# Patient Record
Sex: Female | Born: 1975 | Race: Black or African American | Hispanic: No | Marital: Single | Smoking: Current every day smoker
Health system: Southern US, Community
[De-identification: ages and names within clinical notes are randomized; demographics above are authoritative.]

## PROBLEM LIST (undated history)

## (undated) DIAGNOSIS — G459 Transient cerebral ischemic attack, unspecified: Secondary | ICD-10-CM

## (undated) HISTORY — PX: APPENDECTOMY: SHX54

## (undated) HISTORY — PX: HAND SURGERY: SHX662

## (undated) HISTORY — PX: ABDOMINAL HYSTERECTOMY: SHX81

---

## 2013-01-19 ENCOUNTER — Emergency Department (HOSPITAL_BASED_OUTPATIENT_CLINIC_OR_DEPARTMENT_OTHER)
Admission: EM | Admit: 2013-01-19 | Discharge: 2013-01-19 | Disposition: A | Discharge status: Home / Self Care | Payer: Self-pay | Attending: Emergency Medicine | Admitting: Emergency Medicine

## 2013-01-19 ENCOUNTER — Encounter (HOSPITAL_BASED_OUTPATIENT_CLINIC_OR_DEPARTMENT_OTHER): Payer: Self-pay | Admitting: Emergency Medicine

## 2013-01-19 DIAGNOSIS — Y939 Activity, unspecified: Secondary | ICD-10-CM | POA: Insufficient documentation

## 2013-01-19 DIAGNOSIS — W57XXXA Bitten or stung by nonvenomous insect and other nonvenomous arthropods, initial encounter: Secondary | ICD-10-CM | POA: Insufficient documentation

## 2013-01-19 DIAGNOSIS — F17200 Nicotine dependence, unspecified, uncomplicated: Secondary | ICD-10-CM | POA: Insufficient documentation

## 2013-01-19 DIAGNOSIS — S40269A Insect bite (nonvenomous) of unspecified shoulder, initial encounter: Secondary | ICD-10-CM | POA: Insufficient documentation

## 2013-01-19 DIAGNOSIS — Y9229 Other specified public building as the place of occurrence of the external cause: Secondary | ICD-10-CM | POA: Insufficient documentation

## 2013-01-19 NOTE — ED Notes (Signed)
Patient states she stayed at a hotel five days ago.  States she has insect bites on her left arm and hand.

## 2013-01-19 NOTE — ED Provider Notes (Signed)
CSN: [id number]     Arrival date & time 01/19/13  0846 History   First MD Initiated Contact with Patient 01/19/13 [phone]     Chief Complaint  Patient presents with  . Insect Bite   (Consider location/radiation/quality/duration/timing/severity/associated sxs/prior Treatment) Patient is a 37 y.o. female presenting with animal bite. The history is provided by the patient. No language interpreter was used.  Animal Bite Attacking animal: bug. Location:  Shoulder/arm Shoulder/arm injury location:  L arm, R arm, L forearm and R forearm Time since incident:  12 hours Pain details:    Quality:  Itching   Severity:  No pain Incident location:  Another residence Relieved by:  Nothing Associated symptoms: rash     History reviewed. No pertinent past medical history. Past Surgical History  Procedure Laterality Date  . Abdominal hysterectomy    . Appendectomy     No family history on file. History  Substance Use Topics  . Smoking status: Current Every Day Smoker    Types: Cigarettes  . Smokeless tobacco: Not on file  . Alcohol Use: Yes     Comment: rarely   OB History   Grav Para Term Preterm Abortions TAB SAB Ect Mult Living                 Review of Systems  Skin: Positive for rash.  All other systems reviewed and are negative.    Allergies  Review of patient's allergies indicates not on file.  Home Medications  No current outpatient prescriptions on file. BP 145/78  Pulse 79  Temp(Src) 98.6 F (37 C) (Oral)  Resp 20  SpO2 100% Physical Exam  Vitals reviewed. Constitutional: She is oriented to person, place, and time. She appears well-developed and well-nourished.  HENT:  Head: Normocephalic.  Cardiovascular: Normal rate.   Pulmonary/Chest: Effort normal.  Musculoskeletal: She exhibits tenderness.  Scattered bites  Neurological: She is alert and oriented to person, place, and time. She has normal reflexes.  Skin: Skin is warm.  Psychiatric: She has a normal  mood and affect.    ED Course  Procedures (including critical care time) Labs Review Labs Reviewed - No data to display Imaging Review No results found.  EKG Interpretation   None       MDM   1. Bed bug bite    Hydrocortisone and benadryl    Elson Areas, PA-C 01/19/13 [phone]

## 2013-01-21 NOTE — ED Provider Notes (Signed)
History/physical exam/procedure(s) were performed by non-physician practitioner and as supervising physician I was immediately available for consultation/collaboration. I have reviewed all notes and am in agreement with care and plan.   Thelia Tanksley S Zeena Starkel, MD 01/21/13 1506 

## 2013-01-22 ENCOUNTER — Emergency Department (HOSPITAL_BASED_OUTPATIENT_CLINIC_OR_DEPARTMENT_OTHER)
Admission: EM | Admit: 2013-01-22 | Discharge: 2013-01-22 | Disposition: A | Discharge status: Home / Self Care | Payer: Self-pay | Attending: Emergency Medicine | Admitting: Emergency Medicine

## 2013-01-22 ENCOUNTER — Encounter (HOSPITAL_BASED_OUTPATIENT_CLINIC_OR_DEPARTMENT_OTHER): Payer: Self-pay | Admitting: Emergency Medicine

## 2013-01-22 DIAGNOSIS — B86 Scabies: Secondary | ICD-10-CM | POA: Insufficient documentation

## 2013-01-22 DIAGNOSIS — F17200 Nicotine dependence, unspecified, uncomplicated: Secondary | ICD-10-CM | POA: Insufficient documentation

## 2013-01-22 MED ORDER — PERMETHRIN 5 % EX CREA: TOPICAL_CREAM | Freq: Once | CUTANEOUS | Status: DC

## 2013-01-22 NOTE — ED Notes (Signed)
Patient seen 5 days ago and was dx with bed bug bites. She is not improving and states that she has new bites. Patient stayed in a hotel, but says when she got home she washed everything in hot water

## 2013-01-22 NOTE — ED Provider Notes (Signed)
CSN: [id number]     Arrival date & time 01/22/13  1245 History   First MD Initiated Contact with Patient 01/22/13 1302     Chief Complaint  Patient presents with  . Insect Bite   (Consider location/radiation/quality/duration/timing/severity/associated sxs/prior Treatment) HPI Comments: Pt was seen here 5 days ago for bed bugs but not the rash is worsening and it is in the webs of her fingers and up her arms and on her legs:no fever:pt state that the area is itchy  The history is provided by the patient. No language interpreter was used.    History reviewed. No pertinent past medical history. Past Surgical History  Procedure Laterality Date  . Abdominal hysterectomy    . Appendectomy     No family history on file. History  Substance Use Topics  . Smoking status: Current Every Day Smoker    Types: Cigarettes  . Smokeless tobacco: Not on file  . Alcohol Use: Yes     Comment: rarely   OB History   Grav Para Term Preterm Abortions TAB SAB Ect Mult Living                 Review of Systems  Constitutional: Negative.   Respiratory: Negative.   Cardiovascular: Negative.  Negative for chest pain.    Allergies  Review of patient's allergies indicates no known allergies.  Home Medications   Current Outpatient Rx  Name  Route  Sig  Dispense  Refill  . permethrin (ACTICIN) 5 % cream   Topical   Apply topically once.   60 g   0    BP 102/80  Pulse 94  Temp(Src) 98.2 F (36.8 C) (Oral)  Resp 16  SpO2 100% Physical Exam  Nursing note and vitals reviewed. Constitutional: She is oriented to person, place, and time. She appears well-developed and well-nourished.  Cardiovascular: Normal rate and regular rhythm.   Pulmonary/Chest: Effort normal and breath sounds normal.  Neurological: She is alert and oriented to person, place, and time.  Skin:  Red raised area to fingers, arms and legs    ED Course  Procedures (including critical care time) Labs Review Labs  Reviewed - No data to display Imaging Review No results found.  EKG Interpretation   None       MDM   1. Scabies    Will treat for scabies    Teressa Lower, NP 01/22/13 1338

## 2013-01-22 NOTE — ED Provider Notes (Signed)
Medical screening examination/treatment/procedure(s) were performed by non-physician practitioner and as supervising physician I was immediately available for consultation/collaboration.  EKG Interpretation   None         Glynn Octave, MD 01/22/13 [phone]

## 2013-05-14 ENCOUNTER — Emergency Department (HOSPITAL_BASED_OUTPATIENT_CLINIC_OR_DEPARTMENT_OTHER)
Admission: EM | Admit: 2013-05-14 | Discharge: 2013-05-14 | Disposition: A | Discharge status: Home / Self Care | Payer: Self-pay | Attending: Emergency Medicine | Admitting: Emergency Medicine

## 2013-05-14 ENCOUNTER — Encounter (HOSPITAL_BASED_OUTPATIENT_CLINIC_OR_DEPARTMENT_OTHER): Payer: Self-pay | Admitting: Emergency Medicine

## 2013-05-14 DIAGNOSIS — J069 Acute upper respiratory infection, unspecified: Secondary | ICD-10-CM | POA: Insufficient documentation

## 2013-05-14 DIAGNOSIS — R1013 Epigastric pain: Secondary | ICD-10-CM | POA: Insufficient documentation

## 2013-05-14 DIAGNOSIS — J019 Acute sinusitis, unspecified: Secondary | ICD-10-CM | POA: Insufficient documentation

## 2013-05-14 DIAGNOSIS — F17200 Nicotine dependence, unspecified, uncomplicated: Secondary | ICD-10-CM | POA: Insufficient documentation

## 2013-05-14 DIAGNOSIS — R197 Diarrhea, unspecified: Secondary | ICD-10-CM | POA: Insufficient documentation

## 2013-05-14 MED ORDER — OXYMETAZOLINE HCL 0.05 % NA SOLN
1.0000 | Freq: Two times a day (BID) | NASAL | Status: DC | PRN
  Administered 2013-05-14: 1 via NASAL
  Filled 2013-05-14: qty 15

## 2013-05-14 NOTE — ED Provider Notes (Addendum)
CSN: [id number]     Arrival date & time 05/14/13  1232 History   First MD Initiated Contact with Patient 05/14/13 1247     Chief Complaint  Patient presents with  . Cough     (Consider location/radiation/quality/duration/timing/severity/associated sxs/prior Treatment) Patient is a 38 y.o. female presenting with cough and diarrhea. The history is provided by the patient.  Cough Cough characteristics:  Non-productive Severity:  Moderate Onset quality:  Gradual Duration:  4 days Timing:  Constant Progression:  Unchanged Chronicity:  New Smoker: yes   Context: sick contacts and upper respiratory infection   Relieved by:  Nothing Worsened by:  Lying down Ineffective treatments:  Cough suppressants and decongestant Associated symptoms: ear fullness, ear pain, rhinorrhea and sinus congestion   Associated symptoms: no chest pain, no chills, no fever, no headaches, no shortness of breath and no wheezing   Risk factors: no recent infection and no recent travel   Diarrhea Quality:  Watery Severity:  Moderate Onset quality:  Gradual Number of episodes:  About 5 episodes a day Duration:  4 days Timing:  Intermittent Progression:  Unchanged Relieved by:  None tried Worsened by:  Nothing tried Ineffective treatments:  None tried Associated symptoms: abdominal pain   Associated symptoms: no chills, no fever and no headaches   Associated symptoms comment:  Abd cramping with diarrhea and some epigastric pain Risk factors: no recent antibiotic use     History reviewed. No pertinent past medical history. Past Surgical History  Procedure Laterality Date  . Abdominal hysterectomy    . Appendectomy     No family history on file. History  Substance Use Topics  . Smoking status: Current Every Day Smoker    Types: Cigarettes  . Smokeless tobacco: Not on file  . Alcohol Use: Yes   OB History   Grav Para Term Preterm Abortions TAB SAB Ect Mult Living                 Review of  Systems  Constitutional: Negative for fever and chills.  HENT: Positive for ear pain and rhinorrhea.   Respiratory: Positive for cough. Negative for shortness of breath and wheezing.   Cardiovascular: Negative for chest pain.  Gastrointestinal: Positive for abdominal pain and diarrhea.  Neurological: Negative for headaches.  All other systems reviewed and are negative.      Allergies  Review of patient's allergies indicates no known allergies.  Home Medications   Current Outpatient Rx  Name  Route  Sig  Dispense  Refill  . permethrin (ACTICIN) 5 % cream   Topical   Apply topically once.   60 g   0    BP 127/78  Pulse 74  Temp(Src) 99.1 F (37.3 C) (Oral)  Resp 20  Ht 5\' 11"  (1.803 m)  Wt 249 lb (112.946 kg)  BMI 34.74 kg/m2  SpO2 100% Physical Exam  Nursing note and vitals reviewed. Constitutional: She is oriented to person, place, and time. She appears well-developed and well-nourished. No distress.  HENT:  Head: Normocephalic and atraumatic.  Right Ear: Tympanic membrane is bulging. A middle ear effusion is present.  Left Ear: Tympanic membrane and ear canal normal.  Nose: Mucosal edema and rhinorrhea present. Right sinus exhibits maxillary sinus tenderness.  Mouth/Throat: Posterior oropharyngeal erythema present.  Eyes: Conjunctivae and EOM are normal. Pupils are equal, round, and reactive to light.  Neck: Normal range of motion. Neck supple.  Cardiovascular: Normal rate, regular rhythm and intact distal pulses.   No murmur  heard. Pulmonary/Chest: Effort normal and breath sounds normal. No respiratory distress. She has no wheezes. She has no rales.  Abdominal: Soft. She exhibits no distension. There is tenderness in the epigastric area. There is no rebound and no guarding.  Musculoskeletal: Normal range of motion. She exhibits no edema and no tenderness.  Neurological: She is alert and oriented to person, place, and time.  Skin: Skin is warm and dry. No rash  noted. No erythema.  Psychiatric: She has a normal mood and affect. Her behavior is normal.    ED Course  Procedures (including critical care time) Labs Review Labs Reviewed - No data to display Imaging Review No results found.  EKG Interpretation   None       MDM   Final diagnoses:  Sinusitis, acute  URI, acute   Pt with symptoms consistent with viral URI with sinusitis.  Well appearing here.  No signs of breathing difficulty  No signs of pharyngitis, otitis or abnormal abdominal findings.    pt to return with any further problems.     Gwyneth Sprout, MD 05/14/13 1304  Gwyneth Sprout, MD 05/14/13 1306

## 2013-05-14 NOTE — ED Notes (Signed)
C/o head congestion, prod cough x 4 days-NAD

## 2013-05-14 NOTE — ED Notes (Signed)
Pt now states she is also having diarrhea

## 2015-01-28 ENCOUNTER — Encounter (HOSPITAL_BASED_OUTPATIENT_CLINIC_OR_DEPARTMENT_OTHER): Payer: Self-pay | Admitting: *Deleted

## 2015-01-28 ENCOUNTER — Emergency Department (HOSPITAL_BASED_OUTPATIENT_CLINIC_OR_DEPARTMENT_OTHER): Payer: Self-pay

## 2015-01-28 ENCOUNTER — Emergency Department (HOSPITAL_BASED_OUTPATIENT_CLINIC_OR_DEPARTMENT_OTHER)
Admission: EM | Admit: 2015-01-28 | Discharge: 2015-01-28 | Disposition: A | Discharge status: Home / Self Care | Payer: Self-pay | Attending: Emergency Medicine | Admitting: Emergency Medicine

## 2015-01-28 DIAGNOSIS — R002 Palpitations: Secondary | ICD-10-CM | POA: Insufficient documentation

## 2015-01-28 DIAGNOSIS — Z720 Tobacco use: Secondary | ICD-10-CM | POA: Insufficient documentation

## 2015-01-28 DIAGNOSIS — M25512 Pain in left shoulder: Secondary | ICD-10-CM | POA: Insufficient documentation

## 2015-01-28 LAB — CBC WITH DIFFERENTIAL/PLATELET
BASOS ABS: 0 10*3/uL (ref 0.0–0.1)
Basophils Relative: 0 %
Eosinophils Absolute: 0 10*3/uL (ref 0.0–0.7)
Eosinophils Relative: 1 %
HEMATOCRIT: 42.2 % (ref 36.0–46.0)
Hemoglobin: 13.9 g/dL (ref 12.0–15.0)
Lymphocytes Relative: 42 %
Lymphs Abs: 2.3 10*3/uL (ref 0.7–4.0)
MCH: 33.8 pg (ref 26.0–34.0)
MCHC: 32.9 g/dL (ref 30.0–36.0)
MCV: 102.7 fL — AB (ref 78.0–100.0)
MONO ABS: 0.3 10*3/uL (ref 0.1–1.0)
Monocytes Relative: 6 %
Neutro Abs: 2.8 10*3/uL (ref 1.7–7.7)
Neutrophils Relative %: 51 %
Platelets: 265 10*3/uL (ref 150–400)
RBC: 4.11 MIL/uL (ref 3.87–5.11)
RDW: 13.1 % (ref 11.5–15.5)
WBC: 5.4 10*3/uL (ref 4.0–10.5)

## 2015-01-28 LAB — BASIC METABOLIC PANEL
ANION GAP: 8 (ref 5–15)
BUN: 6 mg/dL (ref 6–20)
CALCIUM: 8.9 mg/dL (ref 8.9–10.3)
CO2: 25 mmol/L (ref 22–32)
Chloride: 105 mmol/L (ref 101–111)
Creatinine, Ser: 0.69 mg/dL (ref 0.44–1.00)
GFR calc Af Amer: >60 mL/min (ref >60)
GFR calc non Af Amer: >60 mL/min (ref >60)
GLUCOSE: 80 mg/dL (ref 65–99)
Potassium: 3.6 mmol/L (ref 3.5–5.1)
Sodium: 138 mmol/L (ref 135–145)

## 2015-01-28 LAB — TROPONIN I: Troponin I: <0.03 ng/mL (ref <0.031)

## 2015-01-28 LAB — TSH: TSH: 0.726 u[IU]/mL (ref 0.350–4.500)

## 2015-01-28 NOTE — ED Notes (Signed)
Palpitations all day while at work. Denies pain.

## 2015-01-28 NOTE — ED Provider Notes (Signed)
CSN: [id number]     Arrival date and time 01/28/15  1332 History   First MD Initiated Contact with Patient 01/28/15 1349     Chief Complaint  Patient presents with  . Palpitations     (Consider location/radiation/quality/duration/timing/severity/associated sxs/prior Treatment) Patient is a 39 y.o. female presenting with palpitations.  The history is provided by the patient and medical records.  Palpitations   39 year old female with no significant past medical history presenting to the ED for palpitations.  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 She states today they seemed more mild than normal. She does note that increased with exertional activity. She states she does have some soreness in her left shoulder, but does not describe this as pain. No chest pain at rest or with exertion.  She denies any shortness of breath, dizziness, diaphoresis, nausea, vomiting, or feelings of syncope. Patient has no known cardiac history. Her grandfather had coronary artery disease and several MIs. Patient is a daily smoker. She denies significant caffeine intake, she reports she stopped drinking soda last week.  No hx of thyroid disease. Patient has never been evaluated for palpitations in the past.  VSS.  History reviewed. No pertinent past medical history. Past Surgical History  Procedure Laterality Date  . Abdominal hysterectomy    . Appendectomy     No family history on file. Social History  Substance Use Topics  . Smoking status: Current Every Day Smoker    Types: Cigarettes  . Smokeless tobacco: None  . Alcohol Use: Yes   OB History    No data available     Review of Systems  Cardiovascular: Positive for palpitations.  Musculoskeletal: Positive for arthralgias.  All other systems reviewed and are negative.     Allergies  Review of patient's allergies indicates no known allergies.  Home Medications   Prior to Admission medications   Medication Sig  Start Date End Date Taking? Authorizing Provider  permethrin (ACTICIN) 5 % cream Apply topically once. 01/22/13   Teressa LowerVrinda Pickering, NP   BP 124/81 mmHg  Pulse 80  Temp(Src) 98.3 F (36.8 C) (Oral)  Resp 20  Ht 5\' 11"  (1.803 m)  Wt 200 lb (90.719 kg)  BMI 27.91 kg/m2  SpO2 100%   Physical Exam  Constitutional: She is oriented to person, place, and time. She appears well-developed and well-nourished. No distress.  HENT:  Head: Normocephalic and atraumatic.  Mouth/Throat: Oropharynx is clear and moist.  Eyes: Conjunctivae and EOM are normal. Pupils are equal, round, and reactive to light.  Neck: Normal range of motion. Neck supple.  Cardiovascular: Normal rate, regular rhythm and normal heart sounds.   Pulmonary/Chest: Effort normal and breath sounds normal. No respiratory distress. She has no wheezes.  Abdominal: Soft. Bowel sounds are normal. There is no tenderness. There is no guarding.  Musculoskeletal: Normal range of motion.  Pain of left trapezius muscle that is reproducible with palpation and movement; no bony deformities, swelling, or overlying skin changes of left arm; arm is NVI  Neurological: She is alert and oriented to person, place, and time.  Skin: Skin is warm and dry. She is not diaphoretic.  Psychiatric: She has a normal mood and affect.  Nursing note and vitals reviewed.   ED Course  Procedures (including critical care time) Labs Review Labs Reviewed  CBC WITH DIFFERENTIAL/PLATELET - Abnormal; Notable for the following:    MCV 102.7 (*)    All other components within normal limits  BASIC METABOLIC  PANEL  TROPONIN I  TSH    Imaging Review Dg Chest 2 View  01/28/2015  CLINICAL DATA:  39 year old current smoker with acute onset of mid chest pain and palpitations earlier today. EXAM: CHEST  2 VIEW COMPARISON:  None. FINDINGS: Cardiomediastinal silhouette unremarkable. Suboptimal inspiration accounts for crowded bronchovascular markings in the lower lobes,  left greater than right. Lungs otherwise clear. No localized airspace consolidation. No pleural effusions. No pneumothorax. Normal pulmonary vascularity. Visualized bony thorax intact. IMPRESSION: Suboptimal inspiration.  No acute cardiopulmonary disease. Electronically Signed   By: Hulan Saas M.D.   On: 01/28/2015 14:31   I have personally reviewed and evaluated these images and lab results as part of my medical decision-making.   EKG Interpretation   Date/Time:  Friday January 28 2015 13:42:42 EDT Ventricular Rate:  93 PR Interval:  138 QRS Duration: 80 QT Interval:  372 QTC Calculation: 462 R Axis:   24 Text Interpretation:  Normal sinus rhythm Normal ECG Mild slurred upstroke  of QRS lead II, V3 with normal PR, doubt delta wave Confirmed by  [facility] MD, ERIN ([id number]) on 01/28/2015 4:15:21 PM      MDM   Final diagnoses:  Palpitations   39 year old female here with palpitations.patient states she gets these frequently, has never sought evaluation.  States symptoms today are more mild than previous.  No chest pain. Mild left shoulder soreness which is reproducible on exam with movement and palpation of the left trapezius-- I suspect this is musculoskeletal, patient works as a Lawyer moving patients daily.  EKG is reassuring, slight upstroke in lead 2 but this does not appear to be a delta wave. Signs and symptoms are not concerning for WPW. Lab work and chest x-ray also reassuring, TSH pending. Patient continues to complain of palpitations, she has remained in normal sinus rhythm here in ED.  Patient has no RF for PE, PERC negative.  Denies excessive caffeine intake.  At this time given negative work-up despite persistent symptoms, low suspicion for ACS, PE, dissection, or other acute cardiac event.  Patient appears stable for discharge. She does not currently have a PCP, will refer to the wellness clinic for follow-up.  Discussed plan with patient, he/she acknowledged understanding  and agreed with plan of care.  Return precautions given for new or worsening symptoms.  Case discussed with attending physician, Dr. Dalene Seltzer, who agrees with assessment and plan of care.  Garlon Hatchet, PA-C 01/28/15 1651  Alvira Monday, MD 01/30/15 1400

## 2015-01-28 NOTE — Discharge Instructions (Signed)
Your cardiac work-up was normal today.   [facility]-- call to make appt for next week. Return to the ED for new or worsening symptoms.   Palpitations A palpitation is the feeling that your heartbeat is irregular or is faster than normal. It may feel like your heart is fluttering or skipping a beat. Palpitations are usually not a serious problem. However, in some cases, you may need further medical evaluation. CAUSES  Palpitations can be caused by:  Smoking.  Caffeine or other stimulants, such as diet pills or energy drinks.  Alcohol.  Stress and anxiety.  Strenuous physical activity.  Fatigue.  Certain medicines.  Heart disease, especially if you have a history of irregular heart rhythms (arrhythmias), such as atrial fibrillation, atrial flutter, or supraventricular tachycardia.  An improperly working pacemaker or defibrillator. DIAGNOSIS  To find the cause of your palpitations, your health care provider will take your medical history and perform a physical exam. Your health care provider may also have you take a test called an ambulatory electrocardiogram (ECG). An ECG records your heartbeat patterns over a 24-hour period. You may also have other tests, such as:  Transthoracic echocardiogram (TTE). During echocardiography, sound waves are used to evaluate how blood flows through your heart.  Transesophageal echocardiogram (TEE).  Cardiac monitoring. This allows your health care provider to monitor your heart rate and rhythm in real time.  Holter monitor. This is a portable device that records your heartbeat and can help diagnose heart arrhythmias. It allows your health care provider to track your heart activity for several days, if needed.  Stress tests by exercise or by giving medicine that makes the heart beat faster. TREATMENT  Treatment of palpitations depends on the cause of your symptoms and can vary greatly. Most cases of palpitations do  not require any treatment other than time, relaxation, and monitoring your symptoms. Other causes, such as atrial fibrillation, atrial flutter, or supraventricular tachycardia, usually require further treatment. HOME CARE INSTRUCTIONS   Avoid:  Caffeinated coffee, tea, soft drinks, diet pills, and energy drinks.  Chocolate.  Alcohol.  Stop smoking if you smoke.  Reduce your stress and anxiety. Things that can help you relax include:  A method of controlling things in your body, such as your heartbeats, with your mind (biofeedback).  Yoga.  Meditation.  Physical activity such as swimming, jogging, or walking.  Get plenty of rest and sleep. SEEK MEDICAL CARE IF:   You continue to have a fast or irregular heartbeat beyond 24 hours.  Your palpitations occur more often. SEEK IMMEDIATE MEDICAL CARE IF:  You have chest pain or shortness of breath.  You have a severe headache.  You feel dizzy or you faint. MAKE SURE YOU:  Understand these instructions.  Will watch your condition.  Will get help right away if you are not doing well or get worse.   This information is not intended to replace advice given to you by your health care provider. Make sure you discuss any questions you have with your health care provider.   Document Released: 03/09/2000 Document Revised: 03/17/2013 Document Reviewed: 05/11/2011 Elsevier Interactive Patient Education [employer]2016 [employer]

## 2015-01-30 ENCOUNTER — Encounter (HOSPITAL_BASED_OUTPATIENT_CLINIC_OR_DEPARTMENT_OTHER): Payer: Self-pay | Admitting: Emergency Medicine

## 2015-01-30 ENCOUNTER — Emergency Department (HOSPITAL_BASED_OUTPATIENT_CLINIC_OR_DEPARTMENT_OTHER)
Admission: EM | Admit: 2015-01-30 | Discharge: 2015-01-30 | Disposition: A | Discharge status: Home / Self Care | Payer: Self-pay | Attending: Emergency Medicine | Admitting: Emergency Medicine

## 2015-01-30 ENCOUNTER — Emergency Department (HOSPITAL_BASED_OUTPATIENT_CLINIC_OR_DEPARTMENT_OTHER): Payer: Self-pay

## 2015-01-30 DIAGNOSIS — R002 Palpitations: Secondary | ICD-10-CM | POA: Insufficient documentation

## 2015-01-30 DIAGNOSIS — Z720 Tobacco use: Secondary | ICD-10-CM | POA: Insufficient documentation

## 2015-01-30 LAB — BASIC METABOLIC PANEL
ANION GAP: 6 (ref 5–15)
BUN: 7 mg/dL (ref 6–20)
CO2: 25 mmol/L (ref 22–32)
Calcium: 8.8 mg/dL — ABNORMAL LOW (ref 8.9–10.3)
Chloride: 107 mmol/L (ref 101–111)
Creatinine, Ser: 0.65 mg/dL (ref 0.44–1.00)
GFR calc Af Amer: >60 mL/min (ref >60)
GFR calc non Af Amer: >60 mL/min (ref >60)
Glucose, Bld: 84 mg/dL (ref 65–99)
POTASSIUM: 3.9 mmol/L (ref 3.5–5.1)
Sodium: 138 mmol/L (ref 135–145)

## 2015-01-30 LAB — CBC
HEMATOCRIT: 42 % (ref 36.0–46.0)
Hemoglobin: 13.9 g/dL (ref 12.0–15.0)
MCH: 34 pg (ref 26.0–34.0)
MCHC: 33.1 g/dL (ref 30.0–36.0)
MCV: 102.7 fL — ABNORMAL HIGH (ref 78.0–100.0)
Platelets: 242 10*3/uL (ref 150–400)
RBC: 4.09 MIL/uL (ref 3.87–5.11)
RDW: 13 % (ref 11.5–15.5)
WBC: 5.6 10*3/uL (ref 4.0–10.5)

## 2015-01-30 LAB — TROPONIN I
Troponin I: <0.03 ng/mL (ref <0.031)
Troponin I: <0.03 ng/mL (ref <0.031)

## 2015-01-30 NOTE — Discharge Instructions (Signed)

## 2015-01-30 NOTE — ED Notes (Signed)
Pt states she has been having intermittent tachycaria that occurs when she stands up to work, today she was fine sitting but when she would stand to work her heart rate would increase. States that her coworker checked her oxgen level with pulse ox and heart rate was 220. Normal in triage, nad noted, + nausea, dizziness and pre-syncopal feeling during episodes

## 2015-02-03 NOTE — ED Provider Notes (Signed)
CSN: [id number]     Arrival date & time 01/30/15  1011 History   First MD Initiated Contact with Patient 01/30/15 1020     Chief Complaint  Patient presents with  . Palpitations     (Consider location/radiation/quality/duration/timing/severity/associated sxs/prior Treatment) Patient is a 39 y.o. female presenting with palpitations.  Palpitations Palpitations quality:  Regular Onset quality:  Gradual Timing:  Constant Progression:  Unchanged Chronicity:  Recurrent Context comment:  Ho similar with unremarkable wu Relieved by:  Nothing Worsened by:  Nothing Associated symptoms: no back pain, no chest pain, no nausea, no shortness of breath and no vomiting     History reviewed. No pertinent past medical history. Past Surgical History  Procedure Laterality Date  . Abdominal hysterectomy    . Appendectomy     History reviewed. No pertinent family history. Social History  Substance Use Topics  . Smoking status: Current Every Day Smoker    Types: Cigarettes  . Smokeless tobacco: None  . Alcohol Use: Yes     Comment: ocassional   OB History    No data available     Review of Systems  Respiratory: Negative for shortness of breath.   Cardiovascular: Positive for palpitations. Negative for chest pain.  Gastrointestinal: Negative for nausea and vomiting.  Musculoskeletal: Negative for back pain.  All other systems reviewed and are negative.     Allergies  Review of patient's allergies indicates no known allergies.  Home Medications   Prior to Admission medications   Not on File   BP 106/70 mmHg  Pulse 80  Temp(Src) 98.2 F (36.8 C) (Oral)  Resp 11  Ht  (1.803 m)  Wt 205 lb (92.987 kg)  BMI 28.60 kg/m2  SpO2 99%  LMP  Physical Exam  Constitutional: She is oriented to person, place, and time. She appears well-developed and well-nourished.  HENT:  Head: Normocephalic and atraumatic.  Right Ear: External ear normal.  Left Ear: External ear normal.   Eyes: Conjunctivae and EOM are normal. Pupils are equal, round, and reactive to light.  Neck: Normal range of motion. Neck supple.  Cardiovascular: Normal rate, regular rhythm, normal heart sounds and intact distal pulses.   Pulmonary/Chest: Effort normal and breath sounds normal.  Abdominal: Soft. Bowel sounds are normal. There is no tenderness.  Musculoskeletal: Normal range of motion.  Neurological: She is alert and oriented to person, place, and time.  Skin: Skin is warm and dry.  Vitals reviewed.   ED Course  Procedures (including critical care time) Labs Review Labs Reviewed  BASIC METABOLIC PANEL - Abnormal; Notable for the following:    Calcium 8.8 (*)    All other components within normal limits  CBC - Abnormal; Notable for the following:    MCV 102.7 (*)    All other components within normal limits  TROPONIN I  TROPONIN I    Imaging Review No results found. I have personally reviewed and evaluated these images and lab results as part of my medical decision-making.   EKG Interpretation None      MDM   Final diagnoses:  Palpitations    39 y.o. female with pertinent PMH of prior palpitations presents with recurrent palpitations identical to prior visits.  Happened while at work.  No anxiety or frank pain.  Wu as above unremarkable.  PERC negative.  DC home to fu with cardiology.    I have reviewed all laboratory and imaging studies if ordered as above  1. Palpitations  Mirian MoMatthew Dail Meece, MD 02/03/15 1256

## 2016-07-09 ENCOUNTER — Emergency Department (HOSPITAL_BASED_OUTPATIENT_CLINIC_OR_DEPARTMENT_OTHER)
Admission: EM | Admit: 2016-07-09 | Discharge: 2016-07-10 | Disposition: A | Discharge status: Home / Self Care | Payer: BLUE CROSS/BLUE SHIELD | Attending: Emergency Medicine | Admitting: Emergency Medicine

## 2016-07-09 ENCOUNTER — Encounter (HOSPITAL_BASED_OUTPATIENT_CLINIC_OR_DEPARTMENT_OTHER): Payer: Self-pay | Admitting: *Deleted

## 2016-07-09 DIAGNOSIS — M25531 Pain in right wrist: Secondary | ICD-10-CM | POA: Diagnosis present

## 2016-07-09 DIAGNOSIS — F17210 Nicotine dependence, cigarettes, uncomplicated: Secondary | ICD-10-CM | POA: Insufficient documentation

## 2016-07-09 DIAGNOSIS — M67431 Ganglion, right wrist: Secondary | ICD-10-CM | POA: Diagnosis not present

## 2016-07-09 DIAGNOSIS — M79672 Pain in left foot: Secondary | ICD-10-CM | POA: Insufficient documentation

## 2016-07-09 DIAGNOSIS — M79671 Pain in right foot: Secondary | ICD-10-CM

## 2016-07-09 NOTE — ED Triage Notes (Signed)
c/o right foot and hand pain x 1 week , denies injury

## 2016-07-10 MED ORDER — LIDOCAINE-EPINEPHRINE 1 %-1:100000 IJ SOLN
INTRAMUSCULAR | Status: AC
  Administered 2016-07-10: 1 mL
  Filled 2016-07-10: qty 1

## 2016-07-10 MED ORDER — LIDOCAINE-EPINEPHRINE 2 %-1:100000 IJ SOLN: 20.0000 mL | Freq: Once | INTRAMUSCULAR | Status: DC

## 2016-07-10 MED ORDER — HYDROCODONE-ACETAMINOPHEN 5-325 MG PO TABS: 1.0000 | ORAL_TABLET | ORAL | 0 refills | Status: DC | PRN

## 2016-07-10 NOTE — ED Provider Notes (Addendum)
[facility] Provider Note: Michele Dell, MD, FACEP  CSN: [id number] MRN: [id number] ARRIVAL: 07/09/16 at 2339 ROOM: MH06/MH06   CHIEF COMPLAINT  Extremity Pain   HISTORY OF PRESENT ILLNESS  Michele Olson is a 41 y.o. female who is being treated for bilateral foot pain by the specialist. The pain is located on the medial and anterior aspects of her heels. Pain is worse with ambulation and weightbearing. She has had steroid injections but the pain has returned. She has an appointment with her foot specialist tomorrow but is requesting pain control in the meantime. She describes the pain as moderate to severe. She also complains of a moderately tender knot on her dorsal right wrist more prominent when she flexes her wrist. She has had this before but it resolved on its own previously.  Consultation with the [facility] state controlled substances database reveals the patient has received one prescription for tramadol and one prescription for hydrocodone cough syrup in the past year.Michele Olson   History reviewed. No pertinent past medical history.  Past Surgical History:  Procedure Laterality Date  . ABDOMINAL HYSTERECTOMY    . APPENDECTOMY      No family history on file.  Social History  Substance Use Topics  . Smoking status: Current Every Day Smoker    Types: Cigarettes  . Smokeless tobacco: Not on file  . Alcohol use Yes     Comment: ocassional    Prior to Admission medications   Not on File    Allergies Patient has no known allergies.   REVIEW OF SYSTEMS  Negative except as noted here or in the History of Present Illness.   PHYSICAL EXAMINATION  Initial Vital Signs Blood pressure 139/74, pulse 84, temperature 98.9 F (37.2 C), temperature source Oral, resp. rate 20, height  (1.702 m), weight 235 lb (106.6 kg), SpO2 100 %.  Examination General: Well-developed, well-nourished female in no acute distress; appearance consistent with age of  record HENT: normocephalic; atraumatic Eyes: pupils equal, round and reactive to light; extraocular muscles intact Neck: supple Heart: regular rate and rhythm Lungs: clear to auscultation bilaterally Abdomen: soft; nondistended; nontender; bowel sounds present Extremities: No deformity; full range of motion; tenderness of anterior medial heels bilaterally; tender fluctuant nodule of mid dorsal right wrist just distal to the flexure consistent with a ganglion cyst Neurologic: Awake, alert and oriented; motor function intact in all extremities and symmetric; no facial droop Skin: Warm and dry Psychiatric: Normal mood and affect   RESULTS  Summary of this visit's results, reviewed by myself:   EKG Interpretation  Date/Time:    Ventricular Rate:    PR Interval:    QRS Duration:   QT Interval:    QTC Calculation:   R Axis:     Text Interpretation:        Laboratory Studies: No results found for this or any previous visit (from the past 24 hour(s)). Imaging Studies: No results found.  ED COURSE  Nursing notes and initial vitals signs, including pulse oximetry, reviewed.  Vitals:   07/09/16 2346 07/09/16 2347  BP:  139/74  Pulse:  84  Resp:  20  Temp:  98.9 F (37.2 C)  TempSrc:  Oral  SpO2:  100%  Weight: 235 lb (106.6 kg)   Height:  (1.702 m)     PROCEDURES   ASPIRATION The skin overlying the ganglion cyst was anesthetized with 0.5 milliliters of 1% lidocaine with epinephrine. The cyst was then entered with an  18-gauge needle and aspirated. The patient tolerated this well and there were no immediate complications. The cyst is no longer palpable.  ED DIAGNOSES     ICD-9-CM ICD-10-CM   1. Bilateral foot pain 729.5 M79.671     M79.672   2. Ganglion cyst of dorsum of right wrist 727.41 M67.431        Michele Libra, MD 07/10/16 4098    Michele Libra, MD 07/10/16 [phone]

## 2016-12-11 ENCOUNTER — Encounter (HOSPITAL_BASED_OUTPATIENT_CLINIC_OR_DEPARTMENT_OTHER): Payer: Self-pay | Admitting: *Deleted

## 2016-12-11 ENCOUNTER — Emergency Department (HOSPITAL_BASED_OUTPATIENT_CLINIC_OR_DEPARTMENT_OTHER)
Admission: EM | Admit: 2016-12-11 | Discharge: 2016-12-11 | Disposition: A | Discharge status: Home / Self Care | Payer: BLUE CROSS/BLUE SHIELD | Attending: Emergency Medicine | Admitting: Emergency Medicine

## 2016-12-11 ENCOUNTER — Emergency Department (HOSPITAL_BASED_OUTPATIENT_CLINIC_OR_DEPARTMENT_OTHER): Payer: BLUE CROSS/BLUE SHIELD

## 2016-12-11 DIAGNOSIS — R05 Cough: Secondary | ICD-10-CM | POA: Diagnosis present

## 2016-12-11 DIAGNOSIS — J069 Acute upper respiratory infection, unspecified: Secondary | ICD-10-CM | POA: Insufficient documentation

## 2016-12-11 DIAGNOSIS — F17210 Nicotine dependence, cigarettes, uncomplicated: Secondary | ICD-10-CM | POA: Insufficient documentation

## 2016-12-11 LAB — RAPID STREP SCREEN (MED CTR MEBANE ONLY): Streptococcus, Group A Screen (Direct): NEGATIVE

## 2016-12-11 NOTE — ED Provider Notes (Signed)
[facility] Provider Note   CSN: [id number] Arrival date & time: 12/11/16  1334     History   Chief Complaint Chief Complaint  Patient presents with  . URI    HPI Michele Olson is a 41 y.o. female.  HPI Patient presents with cough for the last several days. Her granddaughter is similar symptoms. Slight sore throat. No real production of cough. No fevers but has had some myalgias. States that last night she developed pain in her left ear that when throbbing into her left side of the head. No numbness or weakness. No confusion. No vomiting. She is not taking anything for the pain.   History reviewed. No pertinent past medical history.  There are no active problems to display for this patient.   Past Surgical History:  Procedure Laterality Date  . ABDOMINAL HYSTERECTOMY    . APPENDECTOMY      OB History    No data available       Home Medications    Prior to Admission medications   Not on File    Family History History reviewed. No pertinent family history.  Social History Social History  Substance Use Topics  . Smoking status: Current Every Day Smoker    Types: Cigarettes  . Smokeless tobacco: Not on file  . Alcohol use Yes     Comment: ocassional     Allergies   Patient has no known allergies.   Review of Systems Review of Systems  Constitutional: Positive for appetite change and fatigue.  HENT: Positive for ear pain and sore throat.   Eyes: Negative for redness.  Respiratory: Positive for cough.   Cardiovascular: Negative for chest pain.  Gastrointestinal: Negative for abdominal pain.  Genitourinary: Negative for flank pain.  Musculoskeletal: Negative for back pain.  Neurological: Positive for headaches.  Hematological: Negative for adenopathy.  Psychiatric/Behavioral: Negative for confusion.     Physical Exam Updated Vital Signs BP 124/85 (BP Location: Left Arm)   Pulse 82   Temp 99.3 F (37.4 C)   Resp 20   Ht 5'  11" (1.803 m)   Wt 99.8 kg (220 lb)   SpO2 100%   BMI 30.68 kg/m   Physical Exam  Constitutional: She appears well-developed.  HENT:  Head: Atraumatic.  Posterior pharyngeal erythema without exudate. Uvula midline. Right TM normal. Left TM may be slightly bulging but does not have effusion or clouding of the TM.  Neck: Neck supple.  Cardiovascular: Normal rate.   Pulmonary/Chest: Effort normal. She has no wheezes. She has no rales.  Abdominal: Soft.  Musculoskeletal: Normal range of motion.  Neurological: She is alert.  Skin: Skin is warm. Capillary refill takes less than 2 seconds.     ED Treatments / Results  Labs (all labs ordered are listed, but only abnormal results are displayed) Labs Reviewed  RAPID STREP SCREEN (NOT AT [facility])  CULTURE, GROUP A STREP [facility])    EKG  EKG Interpretation None       Radiology Dg Chest 2 View  Result Date: 12/11/2016 CLINICAL DATA:  Cough and congestion for 5 days. EXAM: CHEST  2 VIEW COMPARISON:  08/28/2015 and prior radiographs FINDINGS: The cardiomediastinal silhouette is unremarkable. There is no evidence of focal airspace disease, pulmonary edema, suspicious pulmonary nodule/mass, pleural effusion, or pneumothorax. No acute bony abnormalities are identified. IMPRESSION: No active cardiopulmonary disease. Electronically Signed   By: Harmon Pier M.D.   On: 12/11/2016 14:13    Procedures Procedures (including critical care  time)  Medications Ordered in ED Medications - No data to display   Initial Impression / Assessment and Plan / ED Course  I have reviewed the triage vital signs and the nursing notes.  Pertinent labs & imaging results that were available during my care of the patient were reviewed by me and considered in my medical decision making (see chart for details).     Patient with cough. Minimal production. Also left ear pain and headache. Strep negative and negative chest x-ray. Doubt clinical strep. Instructed on  decongestants and antitussives as needed. Discharge home. Granddaughter has similar symptoms.  Final Clinical Impressions(s) / ED Diagnoses   Final diagnoses:  Upper respiratory tract infection, unspecified type    New Prescriptions There are no discharge medications for this patient.    Benjiman Core, MD 12/11/16 1524

## 2016-12-11 NOTE — ED Notes (Signed)
Patient transported to X-ray 

## 2016-12-11 NOTE — ED Notes (Signed)
ED Provider at bedside. 

## 2016-12-11 NOTE — ED Triage Notes (Signed)
Pt reports URI with left ear pain that started several days ago. No meds PTA

## 2016-12-14 LAB — CULTURE, GROUP A STREP (THRC)

## 2017-06-03 ENCOUNTER — Encounter (HOSPITAL_BASED_OUTPATIENT_CLINIC_OR_DEPARTMENT_OTHER): Payer: Self-pay | Admitting: *Deleted

## 2017-06-03 ENCOUNTER — Other Ambulatory Visit: Payer: Self-pay

## 2017-06-03 ENCOUNTER — Emergency Department (HOSPITAL_BASED_OUTPATIENT_CLINIC_OR_DEPARTMENT_OTHER)
Admission: EM | Admit: 2017-06-03 | Discharge: 2017-06-03 | Disposition: A | Discharge status: Home / Self Care | Payer: BLUE CROSS/BLUE SHIELD | Attending: Emergency Medicine | Admitting: Emergency Medicine

## 2017-06-03 DIAGNOSIS — Z79899 Other long term (current) drug therapy: Secondary | ICD-10-CM | POA: Diagnosis not present

## 2017-06-03 DIAGNOSIS — N12 Tubulo-interstitial nephritis, not specified as acute or chronic: Secondary | ICD-10-CM | POA: Insufficient documentation

## 2017-06-03 DIAGNOSIS — F17210 Nicotine dependence, cigarettes, uncomplicated: Secondary | ICD-10-CM | POA: Diagnosis not present

## 2017-06-03 DIAGNOSIS — R300 Dysuria: Secondary | ICD-10-CM | POA: Diagnosis present

## 2017-06-03 LAB — URINALYSIS, ROUTINE W REFLEX MICROSCOPIC
Bilirubin Urine: NEGATIVE
Glucose, UA: NEGATIVE mg/dL
Ketones, ur: 15 mg/dL — AB
NITRITE: POSITIVE — AB
PROTEIN: NEGATIVE mg/dL
pH: 6 (ref 5.0–8.0)

## 2017-06-03 LAB — URINALYSIS, MICROSCOPIC (REFLEX)

## 2017-06-03 LAB — BASIC METABOLIC PANEL
Anion gap: 9 (ref 5–15)
BUN: 7 mg/dL (ref 6–20)
CALCIUM: 8.8 mg/dL — AB (ref 8.9–10.3)
CO2: 22 mmol/L (ref 22–32)
CREATININE: 0.59 mg/dL (ref 0.44–1.00)
Chloride: 107 mmol/L (ref 101–111)
GFR calc Af Amer: >60 mL/min (ref >60)
GFR calc non Af Amer: >60 mL/min (ref >60)
GLUCOSE: 79 mg/dL (ref 65–99)
Potassium: 3.4 mmol/L — ABNORMAL LOW (ref 3.5–5.1)
Sodium: 138 mmol/L (ref 135–145)

## 2017-06-03 MED ORDER — ONDANSETRON HCL 4 MG/2ML IJ SOLN
4.0000 mg | Freq: Once | INTRAMUSCULAR | Status: AC
  Administered 2017-06-03: 4 mg via INTRAVENOUS
  Filled 2017-06-03: qty 2

## 2017-06-03 MED ORDER — ONDANSETRON 4 MG PO TBDP: 4.0000 mg | ORAL_TABLET | Freq: Three times a day (TID) | ORAL | 0 refills | Status: AC | PRN

## 2017-06-03 MED ORDER — ONDANSETRON 4 MG PO TBDP: 4.0000 mg | ORAL_TABLET | Freq: Once | ORAL | Status: DC

## 2017-06-03 MED ORDER — SODIUM CHLORIDE 0.9 % IV SOLN
1.0000 g | Freq: Once | INTRAVENOUS | Status: AC
  Administered 2017-06-03: 1 g via INTRAVENOUS
  Filled 2017-06-03: qty 10

## 2017-06-03 MED ORDER — SULFAMETHOXAZOLE-TRIMETHOPRIM 800-160 MG PO TABS: 1.0000 | ORAL_TABLET | Freq: Two times a day (BID) | ORAL | 0 refills | Status: AC

## 2017-06-03 NOTE — ED Provider Notes (Signed)
[facility] EMERGENCY DEPARTMENT Provider Note   CSN: [id number] Arrival date & time: 06/03/17  1638     History   Chief Complaint Chief Complaint  Patient presents with  . Dysuria    HPI Michele Olson is a 42 y.o. female presenting to the ED with acute onset of urinary symptoms since last night.  Patient states she has dysuria described as burning, as well as increased frequency.  She also endorses mild left flank pain with persistent nausea.  Denies vomiting, fever or chills, hematuria, frequent UTIs, vaginal bleeding or discharge.    The history is provided by the patient.    History reviewed. No pertinent past medical history.  There are no active problems to display for this patient.   Past Surgical History:  Procedure Laterality Date  . ABDOMINAL HYSTERECTOMY    . APPENDECTOMY      OB History    No data available       Home Medications    Prior to Admission medications   Medication Sig Start Date End Date Taking? Authorizing Provider  ondansetron (ZOFRAN ODT) 4 MG disintegrating tablet Take 1 tablet (4 mg total) by mouth every 8 (eight) hours as needed for nausea or vomiting. 06/03/17   Zahirah Cheslock, [place] N, PA-C  sulfamethoxazole-trimethoprim (BACTRIM DS,SEPTRA DS) 800-160 MG tablet Take 1 tablet by mouth 2 (two) times daily for 14 days. 06/03/17 06/17/17  Bernadene Garside, [place] N, PA-C    Family History No family history on file.  Social History Social History   Tobacco Use  . Smoking status: Current Every Day Smoker    Types: Cigarettes  . Smokeless tobacco: Never Used  Substance Use Topics  . Alcohol use: Yes    Comment: ocassional  . Drug use: No     Allergies   Patient has no known allergies.   Review of Systems Review of Systems  Constitutional: Negative for chills and fever.  Gastrointestinal: Positive for nausea. Negative for abdominal pain and vomiting.  Genitourinary: Positive for dysuria, flank pain (Left), frequency and  urgency. Negative for hematuria, vaginal bleeding and vaginal discharge.  All other systems reviewed and are negative.    Physical Exam Updated Vital Signs BP 108/74 (BP Location: Left Arm)   Pulse 66   Temp 98.7 F (37.1 C) (Oral)   Resp 18   Ht 5\' 10"  (1.778 m)   Wt 101.2 kg (223 lb)   SpO2 100%   BMI 32.00 kg/m   Physical Exam  Constitutional: She appears well-developed and well-nourished.  Non-toxic appearance. She does not appear ill. No distress.  HENT:  Head: Normocephalic and atraumatic.  Mouth/Throat: Oropharynx is clear and moist.  Eyes: Conjunctivae are normal.  Cardiovascular: Normal rate, regular rhythm, normal heart sounds and intact distal pulses.  Pulmonary/Chest: Effort normal and breath sounds normal. No respiratory distress.  Abdominal: Soft. Bowel sounds are normal. She exhibits no distension. There is no tenderness. There is no guarding.  No CVA tenderness to percussion  Neurological: She is alert.  Skin: Skin is warm.  Psychiatric: She has a normal mood and affect. Her behavior is normal.  Nursing note and vitals reviewed.    ED Treatments / Results  Labs (all labs ordered are listed, but only abnormal results are displayed) Labs Reviewed  URINALYSIS, ROUTINE W REFLEX MICROSCOPIC - Abnormal; Notable for the following components:      Result Value   APPearance HAZY (*)    Specific Gravity, Urine >1.030 (*)    Hgb urine  dipstick TRACE (*)    Ketones, ur 15 (*)    Nitrite POSITIVE (*)    Leukocytes, UA TRACE (*)    All other components within normal limits  URINALYSIS, MICROSCOPIC (REFLEX) - Abnormal; Notable for the following components:   Bacteria, UA MANY (*)    Squamous Epithelial / LPF TOO NUMEROUS TO COUNT (*)    All other components within normal limits  BASIC METABOLIC PANEL - Abnormal; Notable for the following components:   Potassium 3.4 (*)    Calcium 8.8 (*)    All other components within normal limits  URINE CULTURE    EKG   EKG Interpretation None       Radiology No results found.  Procedures Procedures (including critical care time)  Medications Ordered in ED Medications  cefTRIAXone (ROCEPHIN) 1 g in sodium chloride 0.9 % 100 mL IVPB (0 g Intravenous Stopped 06/03/17 2157)  ondansetron (ZOFRAN) injection 4 mg (4 mg Intravenous Given 06/03/17 2127)     Initial Impression / Assessment and Plan / ED Course  I have reviewed the triage vital signs and the nursing notes.  Pertinent labs & imaging results that were available during my care of the patient were reviewed by me and considered in my medical decision making (see chart for details).     Patient presenting with acute onset of urinary symptoms, mild left flank pain and nausea since last night.  Patient is well-appearing, not in distress.  Afebrile, vital signs stable.  Abdomen is soft and nontender.  UA done in triage significant for nitrite positive infection.  Given patient's nausea and flank pain, suspect pyelonephritis.  BMP ordered with normal kidney function.  Dose of IV Rocephin administered in the ED.  Will discharge with Bactrim and Zofran.  Patient advised to follow-up with your PCP in 2-3 days.  Strict return precautions discussed.  Safe for discharge.  Patient discussed with Dr. Fredderick Phenix.  Discussed results, findings, treatment and follow up. Patient advised of return precautions. Patient verbalized understanding and agreed with plan.   Final Clinical Impressions(s) / ED Diagnoses   Final diagnoses:  Pyelonephritis    ED Discharge Orders        Ordered    sulfamethoxazole-trimethoprim (BACTRIM DS,SEPTRA DS) 800-160 MG tablet  2 times daily     06/03/17 2206    ondansetron (ZOFRAN ODT) 4 MG disintegrating tablet  Every 8 hours PRN     06/03/17 2207       Tiernan Suto, [place] N, PA-C 06/03/17 2340    Rolan Bucco, MD 06/04/17 1510

## 2017-06-03 NOTE — ED Triage Notes (Signed)
Dysuria, frequency and scanty urine output. Symptoms since last night.

## 2017-06-03 NOTE — Discharge Instructions (Signed)
Please read instructions below. Take your antibiotic, Bactrim, as directed until it is gone. Drink plenty of water. You can take zofran every 8 hours as needed for nausea. You can take advil as needed for pain. Schedule an appointment with your primary care provider to follow up in 2-3 days. Return to the ER if you develop a fever, uncontrollable vomiting, or new or concerning symptoms.

## 2017-06-06 LAB — URINE CULTURE: Culture: >=100000 — AB

## 2017-06-07 ENCOUNTER — Telehealth: Payer: Self-pay | Admitting: *Deleted

## 2017-06-07 NOTE — Telephone Encounter (Signed)
Post ED Visit - Positive Culture Follow-up  Culture report reviewed by antimicrobial stewardship pharmacist:  []  Enzo BiNathan Batchelder, Pharm.D. []  Celedonio MiyamotoJeremy Frens, Pharm.D., BCPS AQ-ID [x]  Garvin FilaMike Maccia, Pharm.D., BCPS []  Georgina PillionElizabeth Martin, Pharm.D., BCPS []  Baldwin ParkMinh Pham, [address] BoulevardPharm.D., BCPS, AAHIVP []  Estella HuskMichelle Turner, Pharm.D., BCPS, AAHIVP []  Lysle Pearlachel Rumbarger, PharmD, BCPS []  Blake DivineShannon Parkey, PharmD []  Pollyann SamplesAndy Johnston, PharmD, BCPS  Positive urine culture Treated with Sulfamethoxazole-Trimethoprim, organism sensitive to the same and no further patient follow-up is required at this time.  Virl AxeRobertson, Nadea Kirkland [facility] 06/07/2017, 11:18 AM

## 2018-09-28 ENCOUNTER — Encounter (HOSPITAL_BASED_OUTPATIENT_CLINIC_OR_DEPARTMENT_OTHER): Payer: Self-pay | Admitting: *Deleted

## 2018-09-28 ENCOUNTER — Emergency Department (HOSPITAL_BASED_OUTPATIENT_CLINIC_OR_DEPARTMENT_OTHER): Payer: Self-pay

## 2018-09-28 ENCOUNTER — Emergency Department (HOSPITAL_BASED_OUTPATIENT_CLINIC_OR_DEPARTMENT_OTHER)
Admission: EM | Admit: 2018-09-28 | Discharge: 2018-09-28 | Disposition: A | Discharge status: Home / Self Care | Payer: Self-pay | Attending: Emergency Medicine | Admitting: Emergency Medicine

## 2018-09-28 ENCOUNTER — Other Ambulatory Visit: Payer: Self-pay

## 2018-09-28 DIAGNOSIS — Y9389 Activity, other specified: Secondary | ICD-10-CM | POA: Insufficient documentation

## 2018-09-28 DIAGNOSIS — Y998 Other external cause status: Secondary | ICD-10-CM | POA: Insufficient documentation

## 2018-09-28 DIAGNOSIS — S39012A Strain of muscle, fascia and tendon of lower back, initial encounter: Secondary | ICD-10-CM | POA: Insufficient documentation

## 2018-09-28 DIAGNOSIS — M79661 Pain in right lower leg: Secondary | ICD-10-CM | POA: Insufficient documentation

## 2018-09-28 DIAGNOSIS — Z8673 Personal history of transient ischemic attack (TIA), and cerebral infarction without residual deficits: Secondary | ICD-10-CM | POA: Insufficient documentation

## 2018-09-28 DIAGNOSIS — Y929 Unspecified place or not applicable: Secondary | ICD-10-CM | POA: Insufficient documentation

## 2018-09-28 DIAGNOSIS — F17210 Nicotine dependence, cigarettes, uncomplicated: Secondary | ICD-10-CM | POA: Insufficient documentation

## 2018-09-28 DIAGNOSIS — W010XXA Fall on same level from slipping, tripping and stumbling without subsequent striking against object, initial encounter: Secondary | ICD-10-CM | POA: Insufficient documentation

## 2018-09-28 DIAGNOSIS — Z7982 Long term (current) use of aspirin: Secondary | ICD-10-CM | POA: Insufficient documentation

## 2018-09-28 HISTORY — DX: Transient cerebral ischemic attack, unspecified: G45.9

## 2018-09-28 LAB — URINALYSIS, ROUTINE W REFLEX MICROSCOPIC
Bilirubin Urine: NEGATIVE
Glucose, UA: NEGATIVE mg/dL
Hgb urine dipstick: NEGATIVE
Ketones, ur: NEGATIVE mg/dL
Leukocytes,Ua: NEGATIVE
Nitrite: NEGATIVE
Protein, ur: NEGATIVE mg/dL
Specific Gravity, Urine: 1.015 (ref 1.005–1.030)
pH: 6 (ref 5.0–8.0)

## 2018-09-28 MED ORDER — KETOROLAC TROMETHAMINE 60 MG/2ML IM SOLN
60.0000 mg | Freq: Once | INTRAMUSCULAR | Status: AC
  Administered 2018-09-28: 60 mg via INTRAMUSCULAR
  Filled 2018-09-28: qty 2

## 2018-09-28 MED ORDER — METHOCARBAMOL 500 MG PO TABS: 500.0000 mg | ORAL_TABLET | Freq: Three times a day (TID) | ORAL | 0 refills | Status: DC | PRN

## 2018-09-28 MED ORDER — METHOCARBAMOL 500 MG PO TABS: 500.0000 mg | ORAL_TABLET | Freq: Three times a day (TID) | ORAL | 0 refills | Status: AC | PRN

## 2018-09-28 MED ORDER — ACETAMINOPHEN 500 MG PO TABS
1000.0000 mg | ORAL_TABLET | Freq: Once | ORAL | Status: AC
  Administered 2018-09-28: 1000 mg via ORAL
  Filled 2018-09-28: qty 2

## 2018-09-28 MED ORDER — IBUPROFEN 600 MG PO TABS: 600.0000 mg | ORAL_TABLET | Freq: Four times a day (QID) | ORAL | 0 refills | Status: AC | PRN

## 2018-09-28 NOTE — ED Notes (Signed)
To leave once radiology department scheduled patient for outpatient doppler study.

## 2018-09-28 NOTE — ED Notes (Signed)
Taken to xray at this time. 

## 2018-09-28 NOTE — ED Triage Notes (Signed)
Pt states she has a "pinched nerve' in her back.Reports that she fell 2 weeks ago and back pain has flared up since then. Also she has had pain in her right lower leg for about the same amount of time. She noticed a "knot" in the back of her right leg today. 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

## 2018-09-28 NOTE — ED Provider Notes (Signed)
[facility] EMERGENCY DEPARTMENT Provider Note   CSN: [id number] Arrival date & time: 09/28/18  1851    History   Chief Complaint Chief Complaint  Patient presents with  . Back Pain  . Leg Pain    HPI Michele Olson is a 43 y.o. female.     HPI Patient with history of chronic low back pain states that she fell 2 weeks ago.  Since that time she is had worsening pain of her low back.  Pain is worse on the left side.  Does not radiate into the leg.  Several days ago she noticed a "knot" on the back of her right calf that was tender to touch.  States she has had pain in that right calf for several weeks.  She denies any new numbness or weakness.  No fever or chills.  No extended travel or immobilization. Past Medical History:  Diagnosis Date  . TIA (transient ischemic attack)     There are no active problems to display for this patient.   Past Surgical History:  Procedure Laterality Date  . ABDOMINAL HYSTERECTOMY    . APPENDECTOMY    . HAND SURGERY       OB History   No obstetric history on file.      Home Medications    Prior to Admission medications   Medication Sig Start Date End Date Taking? Authorizing Provider  aspirin 81 MG chewable tablet Chew 81 mg by mouth daily.   Yes [provider]  ibuprofen (ADVIL) 600 MG tablet Take 1 tablet (600 mg total) by mouth every 6 (six) hours as needed. 09/28/18   Loren RacerYelverton, Jera Headings, MD  methocarbamol (ROBAXIN) 500 MG tablet Take 1 tablet (500 mg total) by mouth every 8 (eight) hours as needed for muscle spasms. 09/28/18   Loren RacerYelverton, [place]ton Urieta, MD  ondansetron (ZOFRAN ODT) 4 MG disintegrating tablet Take 1 tablet (4 mg total) by mouth every 8 (eight) hours as needed for nausea or vomiting. 06/03/17   Robinson, [place] N, PA-C    Family History No family history on file.  Social History Social History   Tobacco Use  . Smoking status: Current Every Day Smoker    Types: Cigarettes  . Smokeless tobacco: Never Used   Substance Use Topics  . Alcohol use: Yes    Comment: ocassional  . Drug use: No     Allergies   Patient has no known allergies.   Review of Systems Review of Systems  Constitutional: Negative for chills and fever.  Eyes: Negative for visual disturbance.  Respiratory: Negative for shortness of breath.   Cardiovascular: Negative for chest pain.  Gastrointestinal: Negative for abdominal pain, nausea and vomiting.  Genitourinary: Positive for flank pain. Negative for dysuria and frequency.  Musculoskeletal: Positive for back pain and myalgias. Negative for arthralgias and neck pain.  Skin: Negative for rash and wound.  Neurological: Negative for dizziness, weakness, light-headedness, numbness and headaches.  All other systems reviewed and are negative.    Physical Exam Updated Vital Signs BP 136/78 (BP Location: Right Arm)   Pulse 71   Temp 98.3 F (36.8 C) (Oral)   Resp 16   Ht 5\' 7"  (1.702 m)   Wt 99.8 kg   SpO2 100%   BMI 34.46 kg/m   Physical Exam Vitals signs and nursing note reviewed.  Constitutional:      General: She is not in acute distress.    Appearance: Normal appearance. She is well-developed. She is not ill-appearing.  HENT:     Head: Normocephalic and atraumatic.     Nose: Nose normal.     Mouth/Throat:     Mouth: Mucous membranes are moist.  Eyes:     Pupils: Pupils are equal, round, and reactive to light.  Neck:     Musculoskeletal: Normal range of motion and neck supple.  Cardiovascular:     Rate and Rhythm: Normal rate and regular rhythm.  Pulmonary:     Effort: Pulmonary effort is normal. No respiratory distress.     Breath sounds: Normal breath sounds. No stridor. No wheezing, rhonchi or rales.  Chest:     Chest wall: No tenderness.  Abdominal:     General: Bowel sounds are normal.     Palpations: Abdomen is soft.     Tenderness: There is no abdominal tenderness. There is no guarding or rebound.  Musculoskeletal: Normal range of  motion.        General: Tenderness present.     Comments: No definite CVA tenderness.  Patient does have left paraspinal lumbar muscular tenderness to palpation.  Small nodule noted on the posterior surface of the right calf.  Mildly tender.  No obvious erythema or warmth.  Distal pulses are 2+.  Skin:    General: Skin is warm and dry.     Findings: No erythema or rash.  Neurological:     General: No focal deficit present.     Mental Status: She is alert and oriented to person, place, and time.     Comments: 5/5 motor in bilateral lower extremities.  Sensation intact.  No saddle anesthesia.  Psychiatric:        Behavior: Behavior normal.      ED Treatments / Results  Labs (all labs ordered are listed, but only abnormal results are displayed) Labs Reviewed  URINALYSIS, ROUTINE W REFLEX MICROSCOPIC    EKG None  Radiology Dg Lumbar Spine Complete  Result Date: 09/28/2018 CLINICAL DATA:  Fall a week ago. History of TIA and left leg weakness. Pain across lower back and down left leg. EXAM: LUMBAR SPINE - COMPLETE 4+ VIEW COMPARISON:  CT scan November 10, 2017 FINDINGS: There is no evidence of lumbar spine fracture. Alignment is normal. Intervertebral disc spaces are maintained. IMPRESSION: Negative. Electronically Signed   By: Gerome Samavid  Williams III M.D   On: 09/28/2018 19:51    Procedures Procedures (including critical care time)  Medications Ordered in ED Medications  acetaminophen (TYLENOL) tablet 1,000 mg (1,000 mg Oral Given 09/28/18 1929)  ketorolac (TORADOL) injection 60 mg (60 mg Intramuscular Given 09/28/18 1930)     Initial Impression / Assessment and Plan / ED Course  I have reviewed the triage vital signs and the nursing notes.  Pertinent labs & imaging results that were available during my care of the patient were reviewed by me and considered in my medical decision making (see chart for details).        X-ray without acute findings.  No evidence of urinary tract  infection.  Patient return tomorrow for ultrasound to rule out DVT of the right leg.  Will treat symptomatically.  Return precautions given.  Final Clinical Impressions(s) / ED Diagnoses   Final diagnoses:  Strain of lumbar region, initial encounter  Right calf pain    ED Discharge Orders         Ordered    ibuprofen (ADVIL) 600 MG tablet  Every 6 hours PRN     09/28/18 2101    methocarbamol (ROBAXIN) 500  MG tablet  Every 8 hours PRN,   Status:  Discontinued     09/28/18 2101    VAS [place] LOWER EXTREMITY VENOUS (DVT)     09/28/18 2101    methocarbamol (ROBAXIN) 500 MG tablet  Every 8 hours PRN     09/28/18 2102           Julianne Rice, MD 09/28/18 2104

## 2018-09-29 ENCOUNTER — Ambulatory Visit (HOSPITAL_BASED_OUTPATIENT_CLINIC_OR_DEPARTMENT_OTHER)
Admission: RE | Admit: 2018-09-29 | Discharge: 2018-09-29 | Disposition: A | Discharge status: Home / Self Care | Payer: Self-pay | Source: Ambulatory Visit | Attending: Emergency Medicine | Admitting: Emergency Medicine

## 2018-09-29 DIAGNOSIS — M7121 Synovial cyst of popliteal space [Baker], right knee: Secondary | ICD-10-CM | POA: Insufficient documentation

## 2018-09-29 DIAGNOSIS — M79661 Pain in right lower leg: Secondary | ICD-10-CM | POA: Insufficient documentation

## 2021-03-13 IMAGING — US RIGHT LOWER EXTREMITY VENOUS ULTRASOUND
1 series · 13 of 24 positions shown · non-contrast
Comparison: None.

CLINICAL DATA: 43-year-old female with a history of right calf pain



[Series 1: right lower extremity venous ultrasound · 13 of 55 slices shown]
[im 1/55]
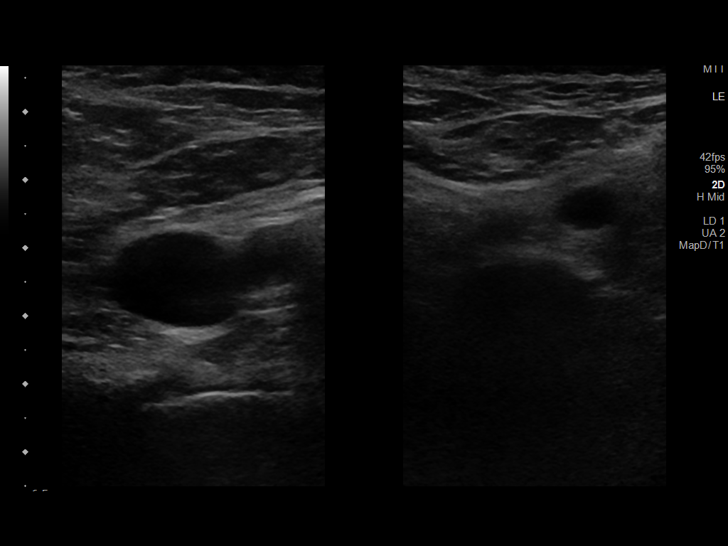
[im 5/55]
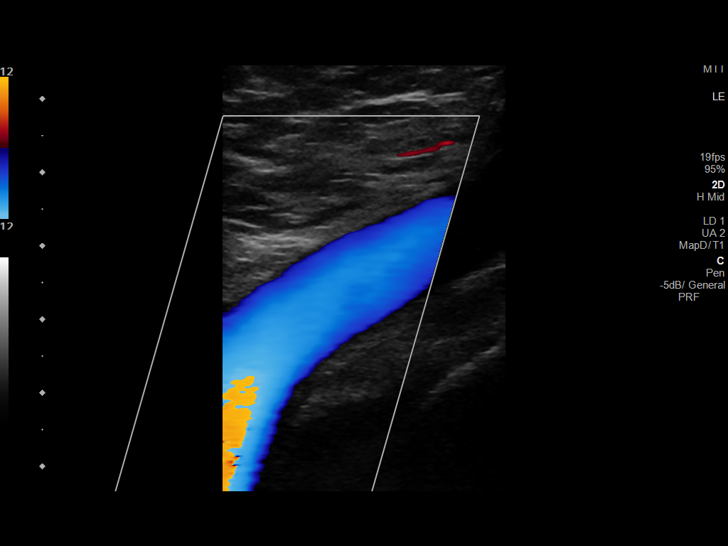
[im 10/55]
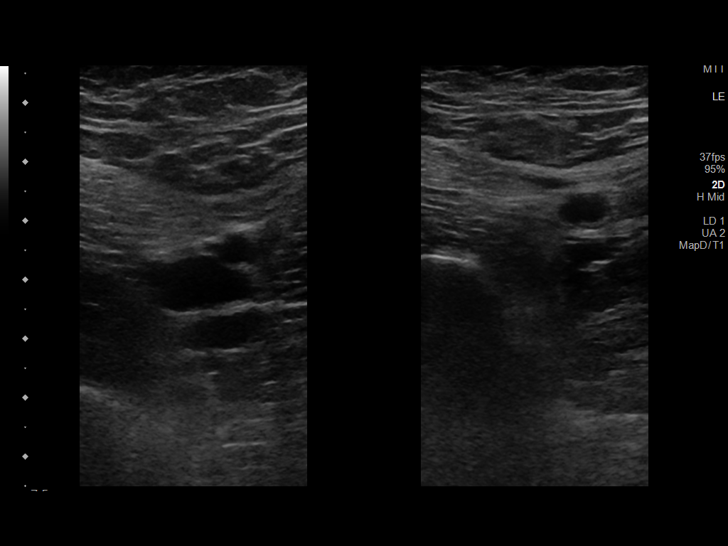
[im 15/55]
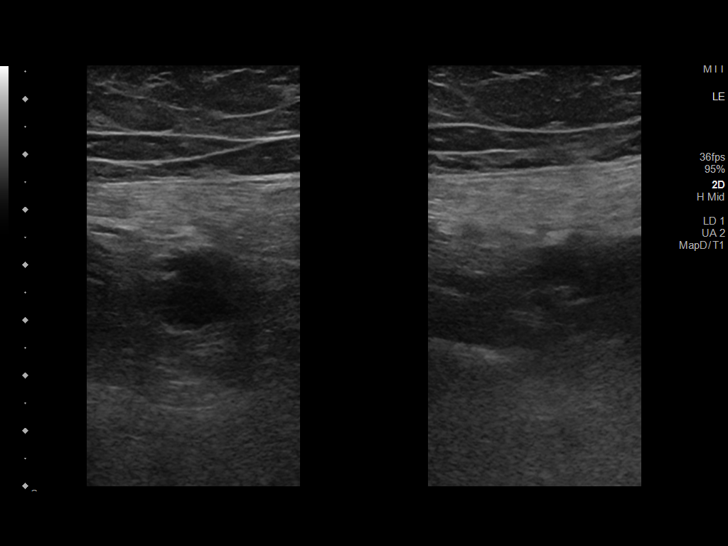
[im 19/55]
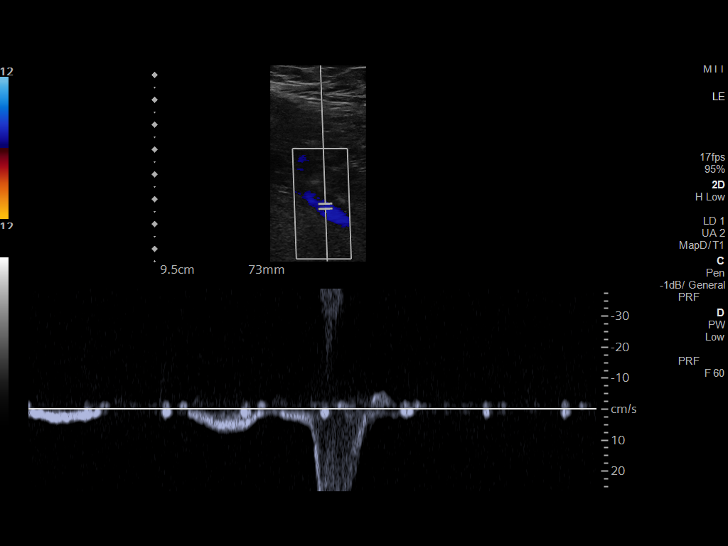
[im 24/55]
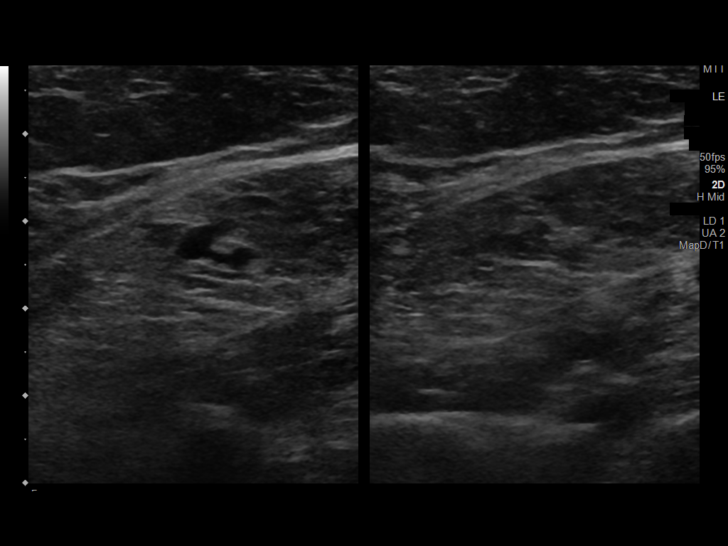
[im 29/55]
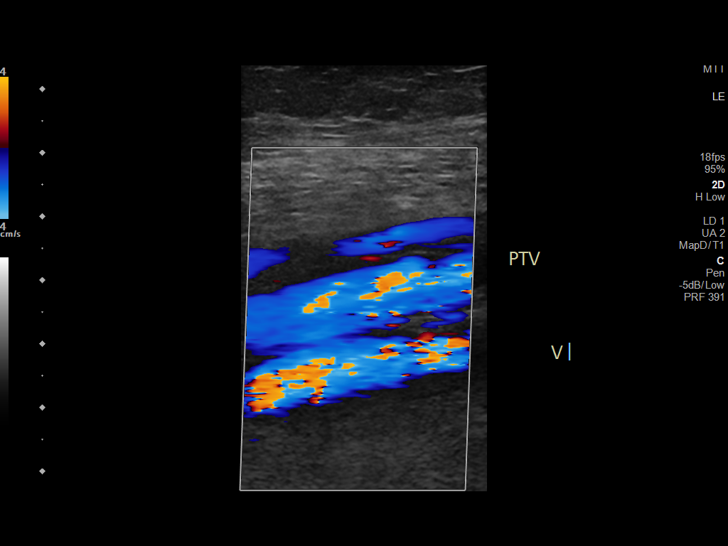
[im 31/55]
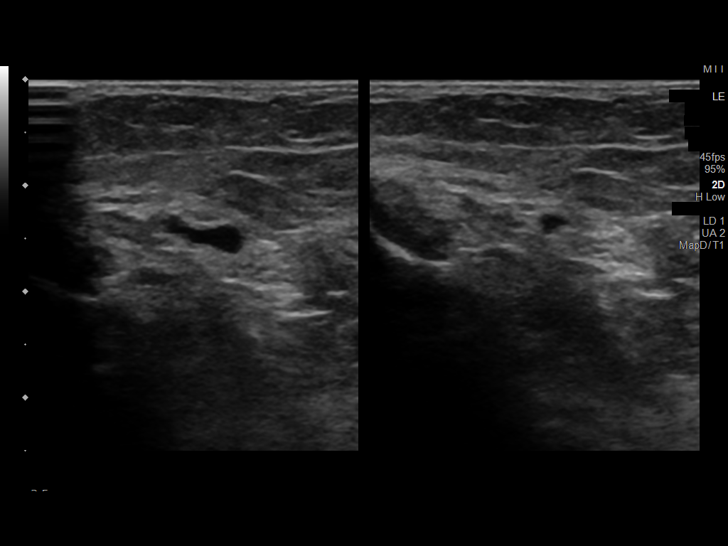
[im 36/55]
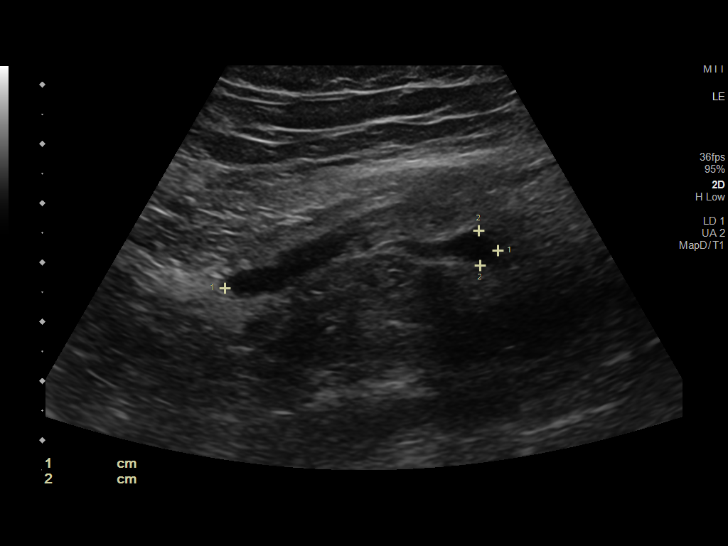
[im 40/55]
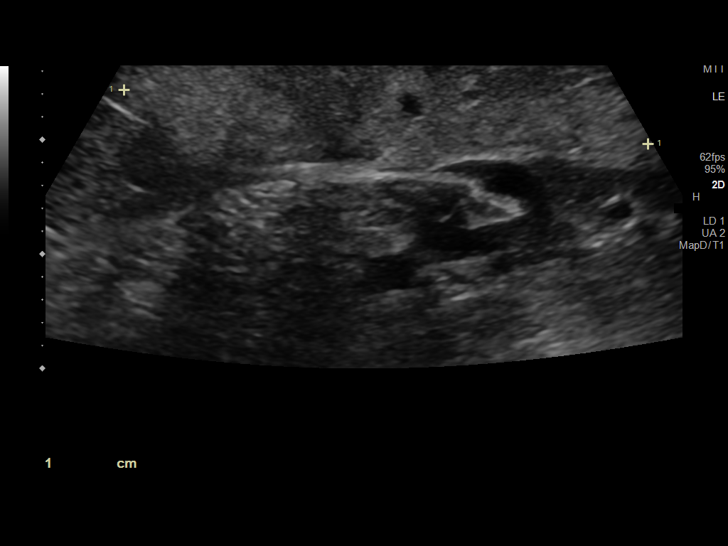
[im 45/55]
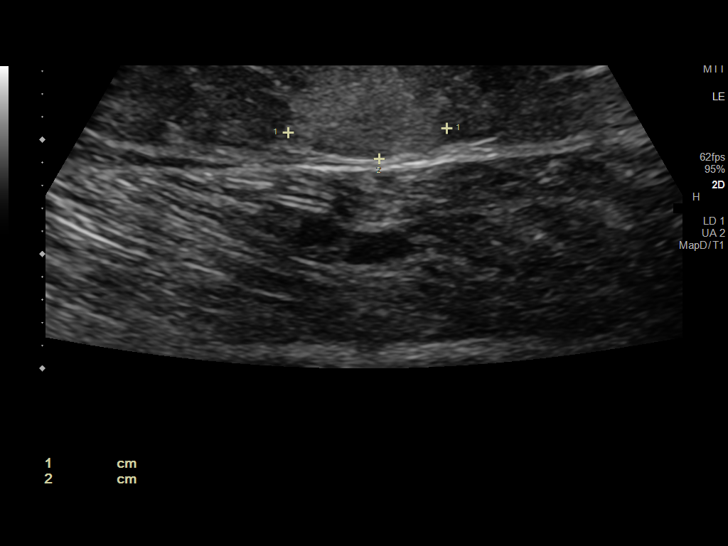
[im 50/55]
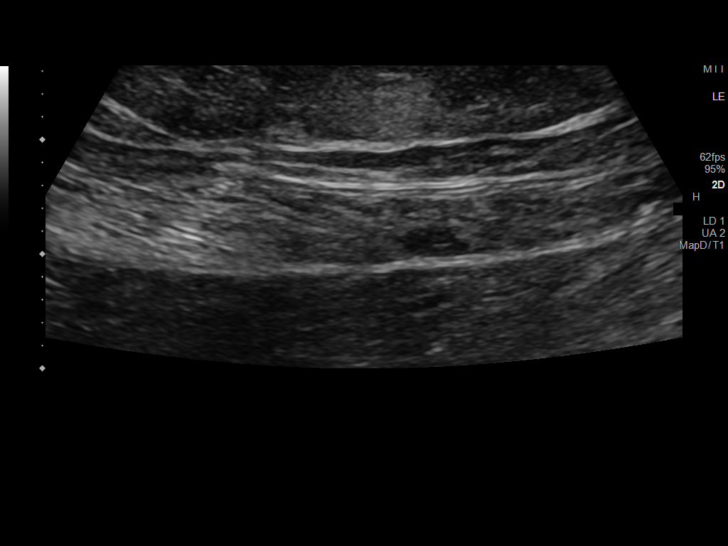
[im 55/55]
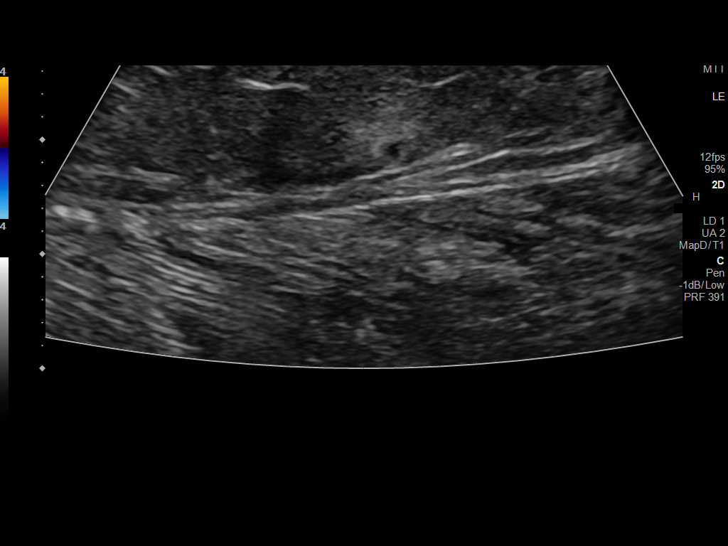

[13 of 24 positions shown; findings below may reference images not displayed]

FINDINGS: Contralateral Common Femoral Vein: Respiratory phasicity is normal
and symmetric with the symptomatic side. No evidence of thrombus.
Normal compressibility.

Common Femoral Vein: No evidence of thrombus. Normal
compressibility, respiratory phasicity and response to augmentation.

Saphenofemoral Junction: No evidence of thrombus. Normal
compressibility and flow on color Doppler imaging.

Profunda Femoral Vein: No evidence of thrombus. Normal
compressibility and flow on color Doppler imaging.

Femoral Vein: No evidence of thrombus. Normal compressibility,
respiratory phasicity and response to augmentation.

Popliteal Vein: No evidence of thrombus. Normal compressibility,
respiratory phasicity and response to augmentation.

Calf Veins: No evidence of thrombus. Normal compressibility and flow
on color Doppler imaging.

Superficial Great Saphenous Vein: No evidence of thrombus. Normal
compressibility and flow on color Doppler imaging.

Other Findings: Lentiform anechoic fluid collection in the popliteal
region measures 4.7 cm x 1.5 cm by 0.6 cm.

Sonographic survey demonstrates 8 region of increased echogenicity
within the superficial soft tissues of the posterior calf measuring
14 mm x 9 mm by 46 mm.
IMPRESSION: Sonographic survey of the right lower extremity negative for DVT

Small Baker's cyst.

Nonspecific focus of increased echogenicity within the posterior
superficial right calf, potentially lipoma, focal edema/trauma, or
other entity. Correlation with the patient's presentation and
physical exam may be useful as the ultrasound findings are
nonspecific.
